# Patient Record
Sex: Male | Born: 1950 | Race: White | Hispanic: No | Marital: Married | Smoking: Former smoker
Health system: Southern US, Community
[De-identification: ages and names within clinical notes are randomized; demographics above are authoritative.]

## PROBLEM LIST (undated history)

## (undated) DIAGNOSIS — I2510 Atherosclerotic heart disease of native coronary artery without angina pectoris: Secondary | ICD-10-CM

## (undated) DIAGNOSIS — E7800 Pure hypercholesterolemia, unspecified: Secondary | ICD-10-CM

## (undated) DIAGNOSIS — E119 Type 2 diabetes mellitus without complications: Secondary | ICD-10-CM

## (undated) DIAGNOSIS — I10 Essential (primary) hypertension: Secondary | ICD-10-CM

## (undated) HISTORY — PX: CORONARY STENT PLACEMENT: SHX1402

---

## 2014-02-15 ENCOUNTER — Emergency Department (INDEPENDENT_AMBULATORY_CARE_PROVIDER_SITE_OTHER): Payer: BC Managed Care – PPO

## 2014-02-15 ENCOUNTER — Encounter: Payer: Self-pay | Admitting: Emergency Medicine

## 2014-02-15 ENCOUNTER — Emergency Department
Admission: EM | Admit: 2014-02-15 | Discharge: 2014-02-15 | Disposition: A | Discharge status: Home / Self Care | Payer: BC Managed Care – PPO | Source: Home / Self Care | Attending: Emergency Medicine | Admitting: Emergency Medicine

## 2014-02-15 DIAGNOSIS — J0100 Acute maxillary sinusitis, unspecified: Secondary | ICD-10-CM

## 2014-02-15 DIAGNOSIS — J4489 Other specified chronic obstructive pulmonary disease: Secondary | ICD-10-CM

## 2014-02-15 DIAGNOSIS — J209 Acute bronchitis, unspecified: Secondary | ICD-10-CM

## 2014-02-15 DIAGNOSIS — R05 Cough: Secondary | ICD-10-CM

## 2014-02-15 DIAGNOSIS — J449 Chronic obstructive pulmonary disease, unspecified: Secondary | ICD-10-CM

## 2014-02-15 DIAGNOSIS — R059 Cough, unspecified: Secondary | ICD-10-CM

## 2014-02-15 HISTORY — DX: Essential (primary) hypertension: I10

## 2014-02-15 HISTORY — DX: Type 2 diabetes mellitus without complications: E11.9

## 2014-02-15 HISTORY — DX: Pure hypercholesterolemia, unspecified: E78.00

## 2014-02-15 MED ORDER — IPRATROPIUM-ALBUTEROL 0.5-2.5 (3) MG/3ML IN SOLN
3.0000 mL | Freq: Once | RESPIRATORY_TRACT | Status: AC
  Administered 2014-02-15: 3 mL via RESPIRATORY_TRACT

## 2014-02-15 MED ORDER — PROMETHAZINE-CODEINE 6.25-10 MG/5ML PO SYRP: ORAL_SOLUTION | ORAL | Status: DC

## 2014-02-15 MED ORDER — METHYLPREDNISOLONE ACETATE 80 MG/ML IJ SUSP
80.0000 mg | Freq: Once | INTRAMUSCULAR | Status: AC
  Administered 2014-02-15: 80 mg via INTRAMUSCULAR

## 2014-02-15 MED ORDER — PREDNISONE 20 MG PO TABS: 20.0000 mg | ORAL_TABLET | Freq: Two times a day (BID) | ORAL | Status: DC

## 2014-02-15 MED ORDER — AZITHROMYCIN 250 MG PO TABS: ORAL_TABLET | ORAL | Status: DC

## 2014-02-15 MED ORDER — CEFTRIAXONE SODIUM 1 G IJ SOLR
1.0000 g | INTRAMUSCULAR | Status: AC
  Administered 2014-02-15: 1 g via INTRAMUSCULAR

## 2014-02-15 NOTE — ED Provider Notes (Signed)
CSN: [id number]     Arrival date & time 02/15/14  1355 History   First MD Initiated Contact with Patient 02/15/14 1413     Chief Complaint  Patient presents with  . Cough  . Nasal Congestion    Patient is a 63 y.o. male presenting with cough. The history is provided by the patient.  Cough Associated symptoms: no chest pain   URI HISTORY  Derrick Bruce is a 63 y.o. male who complains of onset of cold and chest congestion symptoms for 2 weeks . Have been using over-the-counter treatment which only helps a little bit. Currently does not smoke, but has smoked cigarettes in the past  No chills/sweats + Low-grade Fever  +  Nasal congestion +  Discolored Post-nasal drainage No sinus pain/pressure No sore throat  +  Hacking cough, occasionally productive of clear/slightly discolored mucus. Worse at night. + wheezing. Worse at night. + chest congestion No hemoptysis + shortness of breath when he is wheezing No pleuritic pain Denies any chest pain.  No itchy/red eyes No earache  No nausea No vomiting No abdominal pain No diarrhea  No skin rashes +  Fatigue Denies focal neurologic symptoms. Denies visual changes No myalgias +Mild, nonspecific headache   He states his diabetes and hypertension have been controlled, followed by his PCP Past Medical History  Diagnosis Date  . Diabetes mellitus without complication   . Hypertension   . High cholesterol    Past Surgical History  Procedure Laterality Date  . Coronary stent placement     History reviewed. No pertinent family history. History  Substance Use Topics  . Smoking status: Former Games developer  . Smokeless tobacco: Not on file  . Alcohol Use: No    Review of Systems  Respiratory: Positive for cough.   Cardiovascular: Negative for chest pain and palpitations.  Neurological: Negative for syncope.  All other systems reviewed and are negative.  see hpi for further details  Allergies  Review of patient's allergies  indicates no known allergies.  Home Medications   Current Outpatient Rx  Name  Route  Sig  Dispense  Refill  . aspirin EC 325 MG tablet   Oral   Take 325 mg by mouth daily.         Marland Kitchen atorvastatin (LIPITOR) 80 MG tablet   Oral   Take 80 mg by mouth daily.         . carvedilol (COREG) 25 MG tablet   Oral   Take 25 mg by mouth 2 (two) times daily with a meal.         . lisinopril (PRINIVIL,ZESTRIL) 40 MG tablet   Oral   Take 40 mg by mouth daily.         . metFORMIN (GLUCOPHAGE) 1000 MG tablet   Oral   Take 1,000 mg by mouth 2 (two) times daily with a meal.         . omeprazole (PRILOSEC) 20 MG capsule   Oral   Take 20 mg by mouth daily.         . sertraline (ZOLOFT) 100 MG tablet   Oral   Take 100 mg by mouth daily.         Marland Kitchen azithromycin (ZITHROMAX Z-PAK) 250 MG tablet      Take 2 tablets on day one, then 1 tablet daily on days 2 through 5   1 each   0   . predniSONE (DELTASONE) 20 MG tablet   Oral   Take 1  tablet (20 mg total) by mouth 2 (two) times daily with a meal.   10 tablet   0   . promethazine-codeine (PHENERGAN WITH CODEINE) 6.25-10 MG/5ML syrup      Take 1-2 teaspoons every 4-6 hours as needed for cough. May cause drowsiness.   120 mL   0    BP 143/78  Pulse 66  Temp(Src) 98.1 F (36.7 C) (Oral)  Ht 5\' 7"  (1.702 m)  Wt 217 lb 8 oz (98.657 kg)  BMI 34.06 kg/m2  SpO2 94% Physical Exam  Nursing note and vitals reviewed. Constitutional: He is oriented to person, place, and time. He appears well-developed and well-nourished. No distress.  Pleasant male, coughing. He appears ill but no acute cardiorespiratory distress  HENT:  Head: Normocephalic and atraumatic.  Right Ear: Tympanic membrane, external ear and ear canal normal.  Left Ear: Tympanic membrane, external ear and ear canal normal.  Nose: Mucosal edema and rhinorrhea present. Right sinus exhibits maxillary sinus tenderness. Left sinus exhibits maxillary sinus tenderness.   Mouth/Throat: Oropharynx is clear and moist. No oral lesions. No oropharyngeal exudate.  Eyes: Right eye exhibits no discharge. Left eye exhibits no discharge. No scleral icterus.  Neck: Neck supple.  Cardiovascular: Normal rate, regular rhythm and normal heart sounds.   Pulmonary/Chest: Effort normal. He has decreased breath sounds (Mildly decreased diffusely). He has wheezes (mild to moderately expiratory) in the right upper field and the left upper field. He has rhonchi (Diffusely). He has rales (Fine bibasal).  Musculoskeletal:  No calf tenderness or swelling or edema or heat or cords  Lymphadenopathy:    He has no cervical adenopathy.  Neurological: He is alert and oriented to person, place, and time.  Skin: Skin is warm and dry. No rash noted.  Psychiatric: He has a normal mood and affect.    ED Course  Procedures (including critical care time) Labs Review Labs Reviewed - No data to display Imaging Review Dg Chest 2 View  02/15/2014   CLINICAL DATA:  Cough for 2 weeks  EXAM: CHEST  2 VIEW  COMPARISON:  None.  FINDINGS: Cardiac shadow is within normal limits. The lungs are mildly hyperinflated. No acute bony abnormality is seen.  IMPRESSION: COPD without acute abnormality.   Electronically Signed   By: Alcide CleverMark  Lukens M.D.   On: 02/15/2014 15:29     MDM   1. Acute bronchitis with bronchospasm   2. Acute maxillary sinusitis    Reviewed with patient that chest x-ray shows no acute abnormalities. No infiltrates. There is mild hyperinflation, consistent with COPD given his prior history of smoking. Treatment options discussed, as well as risks, benefits, alternatives. Patient voiced understanding and agreement with the following plans:  DuoNeb nebulizer treatment given. Wheezing improved. He still had mild rhonchi, and only minimal late expiratory wheezing, but much improved air excursion. After coughing, the bibasilar rales resolved . Pulse ox on room air after DuoNeb treatment  improved to 96.  Depo-Medrol 80 mg IM Rocephin 1 g IM Prescription for:  Zithromax Z-Pak Prednisone 20 mg by mouth twice a day x5 days Phenergan With Codeine when necessary severe cough, but precautions discussed Other symptomatic care discussed  Follow-up with your primary care doctor in 5-7 days if not improving, or sooner if symptoms become worse. Precautions discussed. Red flags discussed. Questions invited and answered. Patient voiced understanding and agreement.    Lajean Manesavid Massey, MD 02/15/14 [phone]

## 2014-02-15 NOTE — ED Notes (Signed)
Pt has had cough and congestion for 2-3 weeks.  Experiences cough, congestion, wheezing, headaches, productive clear sputum.

## 2015-01-26 ENCOUNTER — Encounter: Payer: Self-pay | Admitting: *Deleted

## 2015-01-26 ENCOUNTER — Emergency Department
Admission: EM | Admit: 2015-01-26 | Discharge: 2015-01-26 | Disposition: A | Discharge status: Home / Self Care | Payer: BLUE CROSS/BLUE SHIELD | Source: Home / Self Care | Attending: Family Medicine | Admitting: Family Medicine

## 2015-01-26 DIAGNOSIS — R69 Illness, unspecified: Principal | ICD-10-CM

## 2015-01-26 DIAGNOSIS — J111 Influenza due to unidentified influenza virus with other respiratory manifestations: Secondary | ICD-10-CM

## 2015-01-26 MED ORDER — BENZONATATE 200 MG PO CAPS: 200.0000 mg | ORAL_CAPSULE | Freq: Every day | ORAL | Status: DC

## 2015-01-26 MED ORDER — OSELTAMIVIR PHOSPHATE 75 MG PO CAPS: 75.0000 mg | ORAL_CAPSULE | Freq: Two times a day (BID) | ORAL | Status: DC

## 2015-01-26 NOTE — ED Provider Notes (Signed)
CSN: [id number]     Arrival date & time 01/26/15  1011 History   First MD Initiated Contact with Patient 01/26/15 1054     Chief Complaint  Patient presents with  . Sinus Problem  . Headache  . Nasal Congestion     HPI Comments: Complains of 1 day history flu-like illness including myalgias, headache, chills, fatigue, and cough.  Also has mild nasal congestion and minimal throat.  Cough is non-productive and somewhat worse at night.  No pleuritic pain or shortness of breath.    The history is provided by the patient.    Past Medical History  Diagnosis Date  . Diabetes mellitus without complication   . Hypertension   . High cholesterol    Past Surgical History  Procedure Laterality Date  . Coronary stent placement     Family History  Problem Relation Age of Onset  . Heart disease Mother   . Heart disease Father    History  Substance Use Topics  . Smoking status: Former Games developermoker  . Smokeless tobacco: Never Used  . Alcohol Use: No    Review of Systems + minimal sore throat + cough No pleuritic pain No wheezing + nasal congestion + post-nasal drainage No sinus pain/pressure No itchy/red eyes No earache + dizzy No hemoptysis No SOB No fever, + chills No nausea No vomiting No abdominal pain No diarrhea No urinary symptoms No skin rash + fatigue + myalgias + headache Used OTC meds without relief  Allergies  Review of patient's allergies indicates no known allergies.  Home Medications   Prior to Admission medications   Medication Sig Start Date End Date Taking? Authorizing Provider  aspirin EC 325 MG tablet Take 325 mg by mouth daily.   Yes Historical Provider, MD  atorvastatin (LIPITOR) 80 MG tablet Take 80 mg by mouth daily.   Yes Historical Provider, MD  carvedilol (COREG) 25 MG tablet Take 25 mg by mouth 2 (two) times daily with a meal.   Yes Historical Provider, MD  lisinopril (PRINIVIL,ZESTRIL) 40 MG tablet Take 40 mg by mouth daily.   Yes Historical  Provider, MD  metFORMIN (GLUCOPHAGE) 1000 MG tablet Take 1,000 mg by mouth 2 (two) times daily with a meal.   Yes Historical Provider, MD  omeprazole (PRILOSEC) 20 MG capsule Take 20 mg by mouth daily.   Yes Historical Provider, MD  sertraline (ZOLOFT) 100 MG tablet Take 100 mg by mouth daily.   Yes Historical Provider, MD  benzonatate (TESSALON) 200 MG capsule Take 1 capsule (200 mg total) by mouth at bedtime. Take as needed for cough 01/26/15   Lattie HawStephen A Keawe Marcello, MD  oseltamivir (TAMIFLU) 75 MG capsule Take 1 capsule (75 mg total) by mouth every 12 (twelve) hours. 01/26/15   Lattie HawStephen A Frieda Arnall, MD   BP 142/82 mmHg  Pulse 82  Temp(Src) 98.6 F (37 C) (Oral)  Resp 14  Wt 214 lb (97.07 kg)  SpO2 95% Physical Exam Nursing notes and Vital Signs reviewed. Appearance:  Patient appears stated age, and in no acute distress Eyes:  Pupils are equal, round, and reactive to light and accomodation.  Extraocular movement is intact.  Conjunctivae are not inflamed  Ears:  Canals normal.  Tympanic membranes normal.  Nose:  Mildly congested turbinates.  No sinus tenderness.    Pharynx:  Normal Neck:  Supple.  Tender enlarged posterior nodes are palpated bilaterally  Lungs:  Clear to auscultation.  Breath sounds are equal.  Heart:  Regular rate and rhythm  without murmurs, rubs, or gallops.  Abdomen:  Nontender without masses or hepatosplenomegaly.  Bowel sounds are present.  No CVA or flank tenderness.  Extremities:  No edema.  No calf tenderness Skin:  No rash present.   ED Course  Procedures  none     MDM   1. Influenza-like illness    Begin Tamiflu.  Prescription written for Benzonatate [facility]) to take at bedtime for night-time cough.  Take plain guaifenesin (  extended release tabs such as Mucinex) twice daily, with plenty of water, for cough and congestion.  May add Pseudoephedrine for sinus congestion if blood pressure is not affected.  Get adequate rest.   May use Afrin nasal spray (or  generic oxymetazoline) twice daily for about 5 days.  Also recommend using saline nasal spray several times daily and saline nasal irrigation (AYR is a common brand).   Stop all antihistamines for now, and other non-prescription cough/cold preparations. May take Ibuprofen , 4 tabs every 8 hours with food for body aches, headache, etc.   Follow-up with family doctor if not improving about one week.     Lattie Haw, MD 01/26/15 [phone]

## 2015-01-26 NOTE — Discharge Instructions (Signed)
Take plain guaifenesin (1200mg  extended release tabs such as Mucinex) twice daily, with plenty of water, for cough and congestion.  May add Pseudoephedrine for sinus congestion if blood pressure is not affected.  Get adequate rest.   May use Afrin nasal spray (or generic oxymetazoline) twice daily for about 5 days.  Also recommend using saline nasal spray several times daily and saline nasal irrigation (AYR is a common brand).   Stop all antihistamines for now, and other non-prescription cough/cold preparations. May take Ibuprofen 200mg , 4 tabs every 8 hours with food for body aches, headache, etc.   Follow-up with family doctor if not improving about one week.

## 2015-01-26 NOTE — ED Notes (Signed)
Masami c/o HA, dry cough, runny nose, congestion and sinus problems x yesterday AM. Denies fever. Taken Claritin with some relief.

## 2015-01-28 ENCOUNTER — Telehealth: Payer: Self-pay | Admitting: *Deleted

## 2015-09-21 ENCOUNTER — Emergency Department (INDEPENDENT_AMBULATORY_CARE_PROVIDER_SITE_OTHER)
Admission: EM | Admit: 2015-09-21 | Discharge: 2015-09-21 | Disposition: A | Discharge status: Home / Self Care | Payer: BLUE CROSS/BLUE SHIELD | Source: Home / Self Care | Attending: Family Medicine | Admitting: Family Medicine

## 2015-09-21 ENCOUNTER — Encounter: Payer: Self-pay | Admitting: *Deleted

## 2015-09-21 DIAGNOSIS — J069 Acute upper respiratory infection, unspecified: Secondary | ICD-10-CM | POA: Diagnosis not present

## 2015-09-21 DIAGNOSIS — B9789 Other viral agents as the cause of diseases classified elsewhere: Principal | ICD-10-CM

## 2015-09-21 MED ORDER — BENZONATATE 200 MG PO CAPS: 200.0000 mg | ORAL_CAPSULE | Freq: Every day | ORAL | Status: AC

## 2015-09-21 MED ORDER — PREDNISONE 20 MG PO TABS: 20.0000 mg | ORAL_TABLET | Freq: Two times a day (BID) | ORAL | Status: AC

## 2015-09-21 MED ORDER — AMOXICILLIN 875 MG PO TABS: 875.0000 mg | ORAL_TABLET | Freq: Two times a day (BID) | ORAL | Status: AC

## 2015-09-21 NOTE — Discharge Instructions (Signed)
Take plain guaifenesin (1200mg  extended release tabs such as Mucinex) twice daily, with plenty of water, for cough and congestion.  Get adequate rest.   May use Afrin nasal spray (or generic oxymetazoline) twice daily for about 5 days and then discontinue.  Also recommend using saline nasal spray several times daily and saline nasal irrigation (AYR is a common brand).   Try warm salt water gargles for sore throat.  Stop all antihistamines for now, and other non-prescription cough/cold preparations. Begin Amoxicillin if not improving about one week or if persistent fever develops   Follow-up with family doctor if not improving about10 days.

## 2015-09-21 NOTE — ED Provider Notes (Signed)
CSN: [id number]     Arrival date & time 09/21/15  0844 History   First MD Initiated Contact with Patient 09/21/15 [phone]     Chief Complaint  Patient presents with  . Cough      HPI Comments: Patient complains of two day history of typical cold-like symptoms including mild sore throat, sinus congestion, headache, fatigue, and cough. He denies fevers, chills, and sweats.  He believes that he may have been wheezing somewhat last night.  Although he does not have asthma, he has a family history of asthma in his mother and maternal grandmother.  The history is provided by the patient.    Past Medical History  Diagnosis Date  . Diabetes mellitus without complication (HCC)   . Hypertension   . High cholesterol    Past Surgical History  Procedure Laterality Date  . Coronary stent placement     Family History  Problem Relation Age of Onset  . Heart disease Mother   . Cancer Mother   . Heart disease Father   . Cancer Father   . Cancer Brother    Social History  Substance Use Topics  . Smoking status: Former Smoker    Quit date: 09/20/2000  . Smokeless tobacco: Never Used  . Alcohol Use: No    Review of Systems + sore throat + dizzy + cough No pleuritic pain ? wheezing + nasal congestion + post-nasal drainage No sinus pain/pressure No itchy/red eyes No earache No hemoptysis No SOB No fever/chills No nausea No vomiting No abdominal pain No diarrhea No urinary symptoms No skin rash + fatigue + myalgias + headache Used OTC meds without relief  Allergies  Review of patient's allergies indicates no known allergies.  Home Medications   Prior to Admission medications   Medication Sig Start Date End Date Taking? Authorizing Provider  Adalimumab (HUMIRA [place]) Inject into the skin.   Yes Historical Provider, MD  amoxicillin (AMOXIL) 875 MG tablet Take 1 tablet (875 mg total) by mouth 2 (two) times daily. (Rx void after 09/29/15) 09/21/15   Lattie HawStephen A Danh Bayus, MD  aspirin EC  325 MG tablet Take 325 mg by mouth daily.    Historical Provider, MD  atorvastatin (LIPITOR) 80 MG tablet Take 80 mg by mouth daily.    Historical Provider, MD  benzonatate (TESSALON) 200 MG capsule Take 1 capsule (200 mg total) by mouth at bedtime. Take as needed for cough 09/21/15   Lattie HawStephen A Shaiann Mcmanamon, MD  carvedilol (COREG) 25 MG tablet Take 25 mg by mouth 2 (two) times daily with a meal.    Historical Provider, MD  lisinopril (PRINIVIL,ZESTRIL) 40 MG tablet Take 40 mg by mouth daily.    Historical Provider, MD  metFORMIN (GLUCOPHAGE) 1000 MG tablet Take 1,000 mg by mouth 2 (two) times daily with a meal.    Historical Provider, MD  omeprazole (PRILOSEC) 20 MG capsule Take 20 mg by mouth daily.    Historical Provider, MD  predniSONE (DELTASONE) 20 MG tablet Take 1 tablet (20 mg total) by mouth 2 (two) times daily. Take with food. 09/21/15   Lattie HawStephen A Saket Hellstrom, MD  sertraline (ZOLOFT) 100 MG tablet Take 100 mg by mouth daily.    Historical Provider, MD   Meds Ordered and Administered this Visit  Medications - No data to display  BP 187/94 mmHg  Pulse 81  Temp(Src) 98.9 F (37.2 C) (Oral)  Resp 16  Ht 5\' 7"  (1.702 m)  Wt 217 lb (98.431 kg)  BMI 33.98  kg/m2  SpO2 95% No data found.   Physical Exam Nursing notes and Vital Signs reviewed. Appearance:  Patient appears stated age, and in no acute distress.  Patient is obese (BMI 34.0) Eyes:  Pupils are equal, round, and reactive to light and accomodation.  Extraocular movement is intact.  Conjunctivae are not inflamed  Ears:  Canals normal.  Tympanic membranes normal.  Nose:  Mildly congested turbinates.  No sinus tenderness.    Pharynx:  Normal Neck:  Supple.   Tender enlarged posterior nodes are palpated bilaterally  Lungs:  Clear to auscultation.  Breath sounds are equal.  Moving air well. Heart:  Regular rate and rhythm without murmurs, rubs, or gallops.  Abdomen:  Nontender without masses or hepatosplenomegaly.  Bowel sounds are present.   No CVA or flank tenderness.  Extremities:  No edema.   Skin:  No rash present.   ED Course  Procedures none    MDM   1. Viral URI with cough    There is no evidence of bacterial infection today.   With a past history of bronchospasm during viral URI, will begin prednisone burst.  Prescription written for Benzonatate (Tessalon) to take at bedtime for night-time cough.  Take plain guaifenesin (  extended release tabs such as Mucinex) twice daily, with plenty of water, for cough and congestion.  Get adequate rest.   May use Afrin nasal spray (or generic oxymetazoline) twice daily for about 5 days and then discontinue.  Also recommend using saline nasal spray several times daily and saline nasal irrigation (AYR is a common brand).   Try warm salt water gargles for sore throat.  Stop all antihistamines for now, and other non-prescription cough/cold preparations. Begin Amoxicillin if not improving about one week or if persistent fever develops (Given a prescription to hold, with an expiration date)  Follow-up with family doctor if not improving about10 days.     Lattie Haw, MD 09/21/15 1228

## 2015-09-21 NOTE — ED Notes (Signed)
Pt c/o productive cough, nasal congestion, and post nasal drip x 2 days. Denies fever.

## 2017-06-08 ENCOUNTER — Emergency Department (INDEPENDENT_AMBULATORY_CARE_PROVIDER_SITE_OTHER): Payer: Medicare Other

## 2017-06-08 ENCOUNTER — Emergency Department
Admission: EM | Admit: 2017-06-08 | Discharge: 2017-06-08 | Disposition: A | Discharge status: Home / Self Care | Payer: Medicare Other | Source: Home / Self Care | Attending: Family Medicine | Admitting: Family Medicine

## 2017-06-08 DIAGNOSIS — S20211A Contusion of right front wall of thorax, initial encounter: Secondary | ICD-10-CM | POA: Diagnosis not present

## 2017-06-08 DIAGNOSIS — I700 Atherosclerosis of aorta: Secondary | ICD-10-CM | POA: Diagnosis not present

## 2017-06-08 HISTORY — DX: Atherosclerotic heart disease of native coronary artery without angina pectoris: I25.10

## 2017-06-08 MED ORDER — HYDROCODONE-ACETAMINOPHEN 5-325 MG PO TABS: 1.0000 | ORAL_TABLET | Freq: Four times a day (QID) | ORAL | 0 refills | Status: AC | PRN

## 2017-06-08 NOTE — Discharge Instructions (Signed)
Apply ice pack for 20 to 30 minutes, 3 to 4 times daily  Continue until pain and swelling decrease.  Wear rib belt sparingly.  If symptoms become significantly worse during the night or over the weekend, proceed to the local emergency room.  

## 2017-06-08 NOTE — ED Triage Notes (Signed)
Pt fell off a step stool on deck Saturday.  Fell on left side.  Denies bruising.  Pain had decreased until he picked up his dog yesterday, and now hurts more.

## 2017-06-08 NOTE — ED Provider Notes (Signed)
[facility]    CSN: [id number] Arrival date & time: 06/08/17  4098     History   Chief Complaint Chief Complaint  Patient presents with  . Chest Pain    left side    HPI Derrick Bruce is a 66 y.o. male.   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  He seemed to be improving until last night when he picked up his 17 pound dog.  He then developed increased left chest pain, worse with movement and inspiration.  No shortness of breath.   The history is provided by the patient and the spouse.  Chest Pain  Pain location:  L chest Pain quality: aching and sharp   Pain radiates to:  Does not radiate Pain severity:  Moderate Onset quality:  Sudden Duration:  5 days Timing:  Constant Progression:  Worsening Chronicity:  New Context: breathing, lifting and movement   Relieved by:  Nothing Worsened by:  Coughing, deep breathing and movement Ineffective treatments: ice pack. Associated symptoms: no abdominal pain, no back pain, no cough, no diaphoresis, no dizziness, no fatigue, no fever, no nausea, no palpitations and no shortness of breath     Past Medical History:  Diagnosis Date  . Coronary artery disease   . Diabetes mellitus without complication (HCC)   . High cholesterol   . Hypertension     There are no active problems to display for this patient.   Past Surgical History:  Procedure Laterality Date  . CORONARY STENT PLACEMENT         Home Medications    Prior to Admission medications   Medication Sig Start Date End Date Taking? Authorizing Provider  methotrexate (RHEUMATREX) 2.5 MG tablet Take 5 mg by mouth every 7 (seven) days.   Yes [provider]  Adalimumab (HUMIRA [place]) Inject into the skin.    [provider]  amoxicillin (AMOXIL) 875 MG tablet Take 1 tablet (875 mg total) by mouth 2 (two) times daily. (Rx void after 09/29/15) 09/21/15   Lattie Haw, MD  aspirin EC 325 MG tablet  Take 325 mg by mouth daily.    [provider]  atorvastatin (LIPITOR) 80 MG tablet Take 80 mg by mouth daily.    [provider]  benzonatate (TESSALON) 200 MG capsule Take 1 capsule (200 mg total) by mouth at bedtime. Take as needed for cough 09/21/15   Lattie Haw, MD  carvedilol (COREG) 25 MG tablet Take 25 mg by mouth 2 (two) times daily with a meal.    [provider]  HYDROcodone-acetaminophen (NORCO/VICODIN) 5-325 MG tablet Take 1 tablet by mouth every 6 (six) hours as needed for moderate pain. 06/08/17 07/09/17  Lattie Haw, MD  lisinopril (PRINIVIL,ZESTRIL) 40 MG tablet Take 40 mg by mouth daily.    [provider]  metFORMIN (GLUCOPHAGE) 1000 MG tablet Take 1,000 mg by mouth 2 (two) times daily with a meal.    [provider]  omeprazole (PRILOSEC) 20 MG capsule Take 20 mg by mouth daily.    [provider]  predniSONE (DELTASONE) 20 MG tablet Take 1 tablet (20 mg total) by mouth 2 (two) times daily. Take with food. 09/21/15   Lattie Haw, MD  sertraline (ZOLOFT) 100 MG tablet Take 100 mg by mouth daily.    [provider]    Family History Family History  Problem Relation Age of Onset  . Heart disease Mother   .  Cancer Mother   . Heart disease Father   . Cancer Father   . Cancer Brother     Social History Social History  Substance Use Topics  . Smoking status: Former Smoker    Quit date: 09/20/2000  . Smokeless tobacco: Never Used  . Alcohol use No     Allergies   Patient has no known allergies.   Review of Systems Review of Systems  Constitutional: Negative for diaphoresis, fatigue and fever.  Respiratory: Negative for cough and shortness of breath.   Cardiovascular: Positive for chest pain. Negative for palpitations.  Gastrointestinal: Negative for abdominal pain and nausea.  Musculoskeletal: Negative for back pain.  Neurological: Negative for dizziness.  All other systems reviewed and  are negative.    Physical Exam Triage Vital Signs ED Triage Vitals  Enc Vitals Group     BP 06/08/17 1015 (!) 178/82     Pulse Rate 06/08/17 1015 66     Resp --      Temp 06/08/17 1015 98.5 F (36.9 C)     Temp Source 06/08/17 1015 Oral     SpO2 06/08/17 1015 96 %     Weight 06/08/17 1016 208 lb (94.3 kg)     Height 06/08/17 1016 5\' 7"  (1.702 m)     Head Circumference --      Peak Flow --      Pain Score 06/08/17 1016 7     Pain Loc --      Pain Edu? --      Excl. in GC? --    No data found.   Updated Vital Signs BP (!) 178/82 (BP Location: Left Arm) Comment: has not taken BP med this am  Pulse 66   Temp 98.5 F (36.9 C) (Oral)   Ht 5\' 7"  (1.702 m)   Wt 208 lb (94.3 kg)   SpO2 96%   BMI 32.58 kg/m   Visual Acuity Right Eye Distance:   Left Eye Distance:   Bilateral Distance:    Right Eye Near:   Left Eye Near:    Bilateral Near:     Physical Exam  Constitutional: He appears well-developed and well-nourished. No distress.  HENT:  Head: Normocephalic.  Right Ear: External ear normal.  Left Ear: External ear normal.  Nose: Nose normal.  Mouth/Throat: Oropharynx is clear and moist.  Eyes: Pupils are equal, round, and reactive to light. Conjunctivae are normal.  Neck: Normal range of motion.  Cardiovascular: Normal heart sounds.   Pulmonary/Chest: Effort normal and breath sounds normal. No respiratory distress. He exhibits tenderness and bony tenderness. He exhibits no laceration, no crepitus and no swelling.    Left chest rib tenderness to palpation as noted on diagram.   Abdominal: Soft. There is no tenderness.  Musculoskeletal: He exhibits no edema.  Neurological: He is alert.  Skin: Skin is warm and dry. No rash noted.  Nursing note and vitals reviewed.    UC Treatments / Results  Labs (all labs ordered are listed, but only abnormal results are displayed) Labs Reviewed - No data to display  EKG  EKG Interpretation None        Radiology Dg Ribs Unilateral W/chest Left  Result Date: 06/08/2017 CLINICAL DATA:  LEFT rib pain and tenderness since falling on Saturday night striking LEFT ribs, history hypertension, diabetes mellitus, coronary artery disease, former smoker EXAM: LEFT RIBS AND CHEST - 3+ VIEW COMPARISON:  Chest radiograph 02/15/2014 FINDINGS: Normal heart size, mediastinal contours, and pulmonary vascularity. Atherosclerotic  calcification aorta. Bronchitic changes without infiltrate, pleural effusion or pneumothorax. BB placed at site of symptoms lower anterior LEFT chest, overlies costal cartilage. Osseous mineralization grossly normal. No definite rib fracture or bone destruction. IMPRESSION: Mild bronchitic changes. No acute LEFT rib abnormalities. Aortic Atherosclerosis (ICD10-I70.0). Electronically Signed   By: Ulyses SouthwardMark  Boles M.D.   On: 06/08/2017 10:45    Procedures Procedures (including critical care time)  Medications Ordered in UC Medications - No data to display   Initial Impression / Assessment and Plan / UC Course  I have reviewed the triage vital signs and the nursing notes.  Pertinent labs & imaging results that were available during my care of the patient were reviewed by me and considered in my medical decision making (see chart for details).    No evidence rib fracture. Dispensed rib belt. Rx for Lortab at bedtime. Apply ice pack for 20 to 30 minutes, 3 to 4 times daily  Continue until pain and swelling decrease.  Wear rib belt sparingly. If symptoms become significantly worse during the night or over the weekend, proceed to the local emergency room.  Followup with Family Doctor if not improved in about two weeks.    Final Clinical Impressions(s) / UC Diagnoses   Final diagnoses:  Contusion, chest wall, right, initial encounter    New Prescriptions New Prescriptions   HYDROCODONE-ACETAMINOPHEN (NORCO/VICODIN) 5-325 MG TABLET    Take 1 tablet by mouth every 6 (six) hours as  needed for moderate pain.     Lattie HawBeese, Meric Joye A, MD 06/08/17 1121

## 2018-10-30 IMAGING — DX DG RIBS W/ CHEST 3+V*L*
3 series · 3 of 3 positions shown · non-contrast
Comparison: Chest radiograph 02/15/2014

CLINICAL DATA: LEFT rib pain and tenderness since falling on
[REDACTED] night striking LEFT ribs, history hypertension, diabetes
mellitus, coronary artery disease, former smoker

EXAM:
LEFT RIBS AND CHEST - 3+ VIEW

[chest pa]
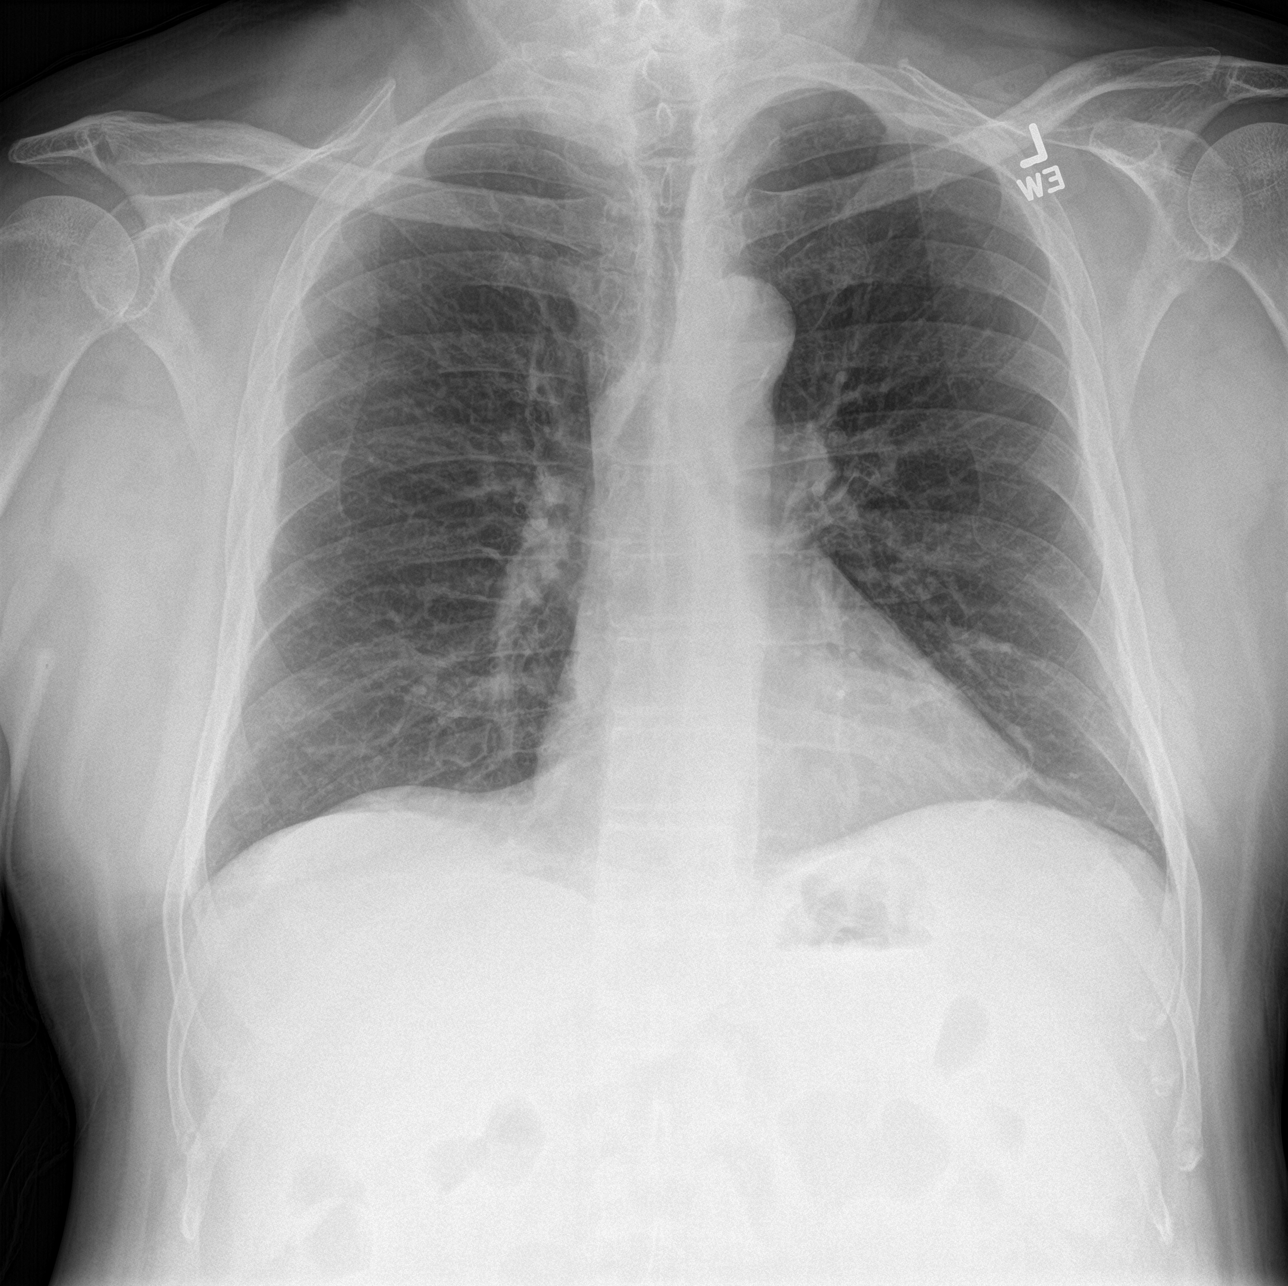

[rib pa]
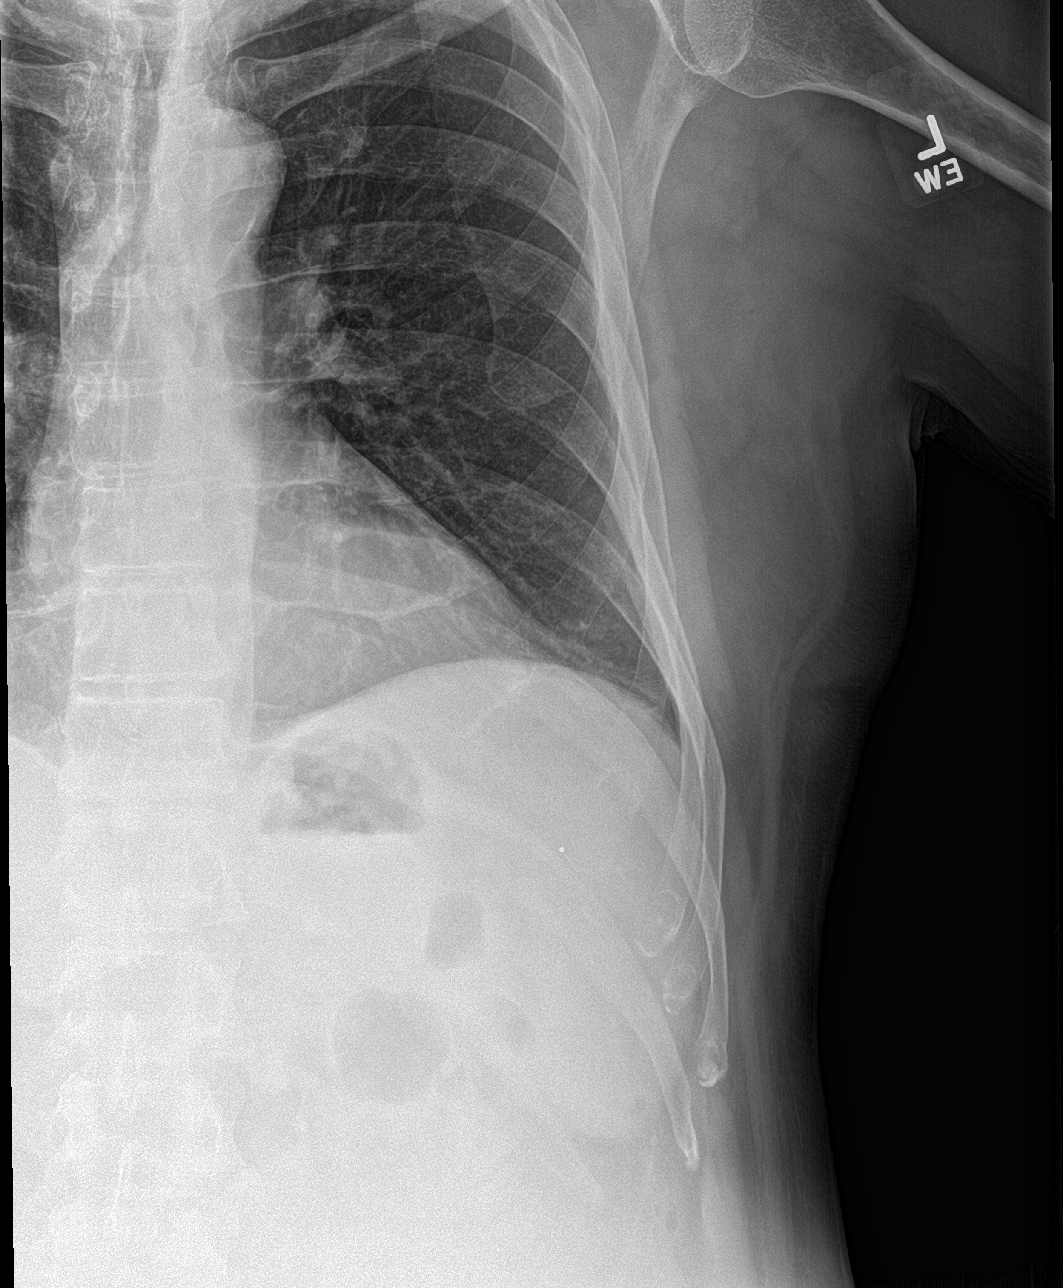

[rib pa obl]
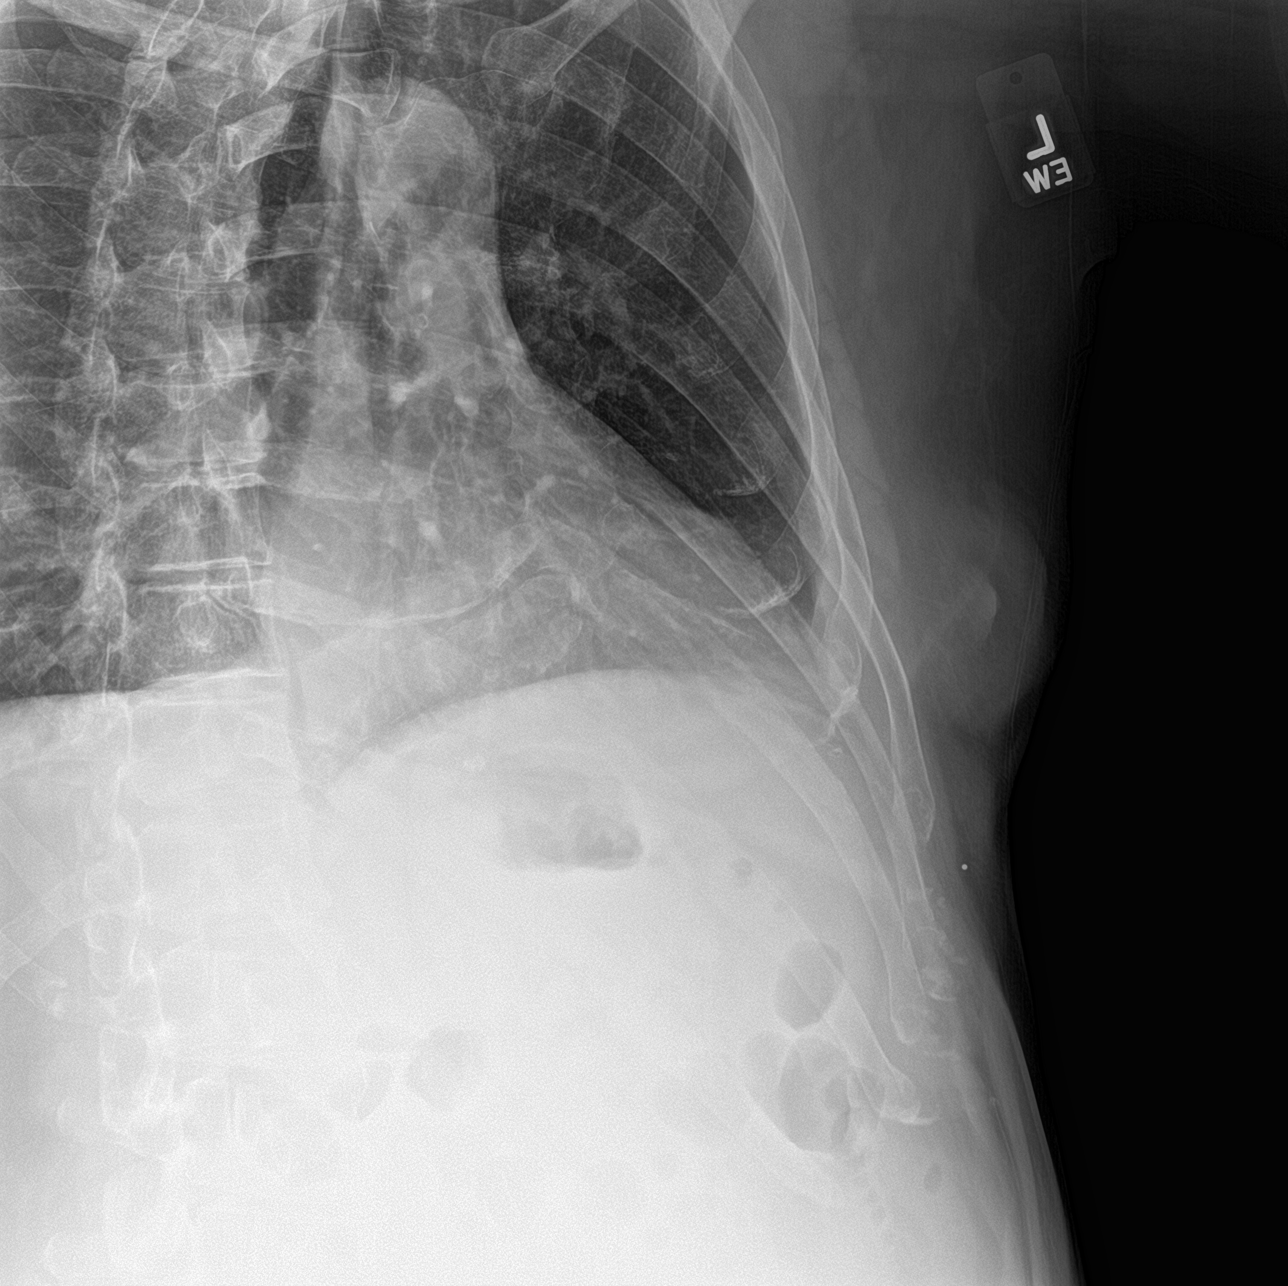

[3 of 3 positions shown; findings below may reference images not displayed]

FINDINGS: Normal heart size, mediastinal contours, and pulmonary vascularity.

Atherosclerotic calcification aorta.

Bronchitic changes without infiltrate, pleural effusion or
pneumothorax.

BB placed at site of symptoms lower anterior LEFT chest, overlies
costal cartilage.

Osseous mineralization grossly normal.

No definite rib fracture or bone destruction.
IMPRESSION: Mild bronchitic changes.

No acute LEFT rib abnormalities.

Aortic Atherosclerosis ([id number]).

## 2025-01-12 DEATH — deceased
